# Patient Record
Sex: Female | Born: 1945 | Race: White | Hispanic: No | Marital: Married | Smoking: Never smoker
Health system: Southern US, Community
[De-identification: ages and names within clinical notes are randomized; demographics above are authoritative.]

## PROBLEM LIST (undated history)

## (undated) DIAGNOSIS — C50919 Malignant neoplasm of unspecified site of unspecified female breast: Secondary | ICD-10-CM

## (undated) DIAGNOSIS — T7840XA Allergy, unspecified, initial encounter: Secondary | ICD-10-CM

## (undated) DIAGNOSIS — Z923 Personal history of irradiation: Secondary | ICD-10-CM

## (undated) HISTORY — PX: HERNIA REPAIR: SHX51

## (undated) HISTORY — PX: CHOLECYSTECTOMY: SHX55

## (undated) HISTORY — PX: KNEE SURGERY: SHX244

## (undated) HISTORY — PX: OTHER SURGICAL HISTORY: SHX169

## (undated) HISTORY — PX: VAGINAL HYSTERECTOMY: SUR661

---

## 2009-02-08 DIAGNOSIS — C50919 Malignant neoplasm of unspecified site of unspecified female breast: Secondary | ICD-10-CM

## 2009-02-08 HISTORY — PX: BREAST BIOPSY: SHX20

## 2009-02-08 HISTORY — DX: Malignant neoplasm of unspecified site of unspecified female breast: C50.919

## 2009-02-08 HISTORY — PX: BREAST LUMPECTOMY: SHX2

## 2009-05-28 ENCOUNTER — Ambulatory Visit: Payer: Self-pay | Admitting: Surgery

## 2009-06-06 ENCOUNTER — Ambulatory Visit: Payer: Self-pay | Admitting: Surgery

## 2009-06-08 ENCOUNTER — Ambulatory Visit: Payer: Self-pay | Admitting: Radiation Oncology

## 2009-06-11 ENCOUNTER — Ambulatory Visit: Payer: Self-pay | Admitting: Surgery

## 2009-06-25 ENCOUNTER — Ambulatory Visit: Payer: Self-pay | Admitting: Internal Medicine

## 2009-07-09 ENCOUNTER — Ambulatory Visit: Payer: Self-pay | Admitting: Internal Medicine

## 2009-07-09 ENCOUNTER — Ambulatory Visit: Payer: Self-pay | Admitting: Radiation Oncology

## 2009-08-08 ENCOUNTER — Ambulatory Visit: Payer: Self-pay | Admitting: Radiation Oncology

## 2009-08-08 ENCOUNTER — Ambulatory Visit: Payer: Self-pay | Admitting: Internal Medicine

## 2009-09-08 ENCOUNTER — Ambulatory Visit: Payer: Self-pay | Admitting: Radiation Oncology

## 2009-09-08 ENCOUNTER — Ambulatory Visit: Payer: Self-pay | Admitting: Internal Medicine

## 2009-10-09 ENCOUNTER — Ambulatory Visit: Payer: Self-pay | Admitting: Internal Medicine

## 2009-10-09 ENCOUNTER — Ambulatory Visit: Payer: Self-pay | Admitting: Radiation Oncology

## 2009-11-08 ENCOUNTER — Ambulatory Visit: Payer: Self-pay | Admitting: Radiation Oncology

## 2009-11-08 ENCOUNTER — Ambulatory Visit: Payer: Self-pay | Admitting: Internal Medicine

## 2010-01-06 ENCOUNTER — Ambulatory Visit: Payer: Self-pay | Admitting: Internal Medicine

## 2010-02-24 ENCOUNTER — Ambulatory Visit: Payer: Self-pay | Admitting: Internal Medicine

## 2010-03-11 ENCOUNTER — Ambulatory Visit: Payer: Self-pay | Admitting: Internal Medicine

## 2010-04-09 ENCOUNTER — Ambulatory Visit: Payer: Self-pay | Admitting: Internal Medicine

## 2010-05-05 ENCOUNTER — Ambulatory Visit: Payer: Self-pay | Admitting: Surgery

## 2010-06-25 ENCOUNTER — Ambulatory Visit: Payer: Self-pay | Admitting: Internal Medicine

## 2010-07-10 ENCOUNTER — Ambulatory Visit: Payer: Self-pay | Admitting: Internal Medicine

## 2010-10-07 ENCOUNTER — Ambulatory Visit: Payer: Self-pay | Admitting: Internal Medicine

## 2010-10-10 ENCOUNTER — Ambulatory Visit: Payer: Self-pay | Admitting: Internal Medicine

## 2010-12-24 ENCOUNTER — Ambulatory Visit: Payer: Self-pay | Admitting: Internal Medicine

## 2011-01-09 ENCOUNTER — Ambulatory Visit: Payer: Self-pay | Admitting: Internal Medicine

## 2011-05-06 ENCOUNTER — Ambulatory Visit: Payer: Self-pay | Admitting: Internal Medicine

## 2011-06-24 ENCOUNTER — Ambulatory Visit: Payer: Self-pay | Admitting: Oncology

## 2011-06-24 LAB — COMPREHENSIVE METABOLIC PANEL WITH GFR
Albumin: 4 g/dL
Alkaline Phosphatase: 86 U/L
Anion Gap: 9
BUN: 25 mg/dL — ABNORMAL HIGH
Bilirubin,Total: 0.5 mg/dL
Calcium, Total: 9.1 mg/dL
Chloride: 105 mmol/L
Co2: 28 mmol/L
Creatinine: 0.81 mg/dL
EGFR (African American): >60
EGFR (Non-African Amer.): >60
Glucose: 115 mg/dL — ABNORMAL HIGH
Osmolality: 288
Potassium: 4.1 mmol/L
SGOT(AST): 15 U/L
SGPT (ALT): 27 U/L
Sodium: 142 mmol/L
Total Protein: 7 g/dL

## 2011-06-24 LAB — CBC CANCER CENTER
Basophil #: 0 x10 3/mm (ref 0.0–0.1)
Eosinophil #: 0.2 x10 3/mm (ref 0.0–0.7)
Eosinophil %: 3.2 %
HGB: 13 g/dL (ref 12.0–16.0)
MCH: 33.7 pg (ref 26.0–34.0)
MCHC: 34.5 g/dL (ref 32.0–36.0)
MCV: 98 fL (ref 80–100)
Monocyte #: 0.3 x10 3/mm (ref 0.2–0.9)
Monocyte %: 4.7 %
Neutrophil #: 4.4 x10 3/mm (ref 1.4–6.5)
Platelet: 233 x10 3/mm (ref 150–440)
RBC: 3.86 10*6/uL (ref 3.80–5.20)
RDW: 13.3 % (ref 11.5–14.5)

## 2011-07-10 ENCOUNTER — Ambulatory Visit: Payer: Self-pay | Admitting: Oncology

## 2011-11-08 ENCOUNTER — Ambulatory Visit: Payer: Self-pay | Admitting: Oncology

## 2011-11-09 ENCOUNTER — Ambulatory Visit: Payer: Self-pay | Admitting: Oncology

## 2011-12-27 ENCOUNTER — Ambulatory Visit: Payer: Self-pay | Admitting: Oncology

## 2012-01-09 ENCOUNTER — Ambulatory Visit: Payer: Self-pay | Admitting: Oncology

## 2012-05-08 ENCOUNTER — Ambulatory Visit: Payer: Self-pay | Admitting: Oncology

## 2012-06-23 ENCOUNTER — Ambulatory Visit: Payer: Self-pay | Admitting: Oncology

## 2012-07-09 ENCOUNTER — Ambulatory Visit: Payer: Self-pay | Admitting: Oncology

## 2012-11-06 ENCOUNTER — Ambulatory Visit: Payer: Self-pay | Admitting: Radiation Oncology

## 2012-11-08 ENCOUNTER — Ambulatory Visit: Payer: Self-pay | Admitting: Radiation Oncology

## 2013-05-09 ENCOUNTER — Ambulatory Visit: Payer: Self-pay | Admitting: Oncology

## 2013-05-10 ENCOUNTER — Ambulatory Visit: Payer: Self-pay | Admitting: Oncology

## 2013-06-08 ENCOUNTER — Ambulatory Visit: Payer: Self-pay | Admitting: Oncology

## 2013-11-06 ENCOUNTER — Ambulatory Visit: Payer: Self-pay | Admitting: Oncology

## 2014-05-14 ENCOUNTER — Ambulatory Visit
Admit: 2014-05-14 | Disposition: A | Discharge status: Home / Self Care | Payer: Self-pay | Attending: Oncology | Admitting: Oncology

## 2014-05-21 ENCOUNTER — Ambulatory Visit
Admit: 2014-05-21 | Disposition: A | Discharge status: Home / Self Care | Payer: Self-pay | Attending: Oncology | Admitting: Oncology

## 2014-11-06 ENCOUNTER — Ambulatory Visit
Admission: RE | Admit: 2014-11-06 | Discharge: 2014-11-06 | Disposition: A | Discharge status: Home / Self Care | Payer: Medicare Other | Source: Ambulatory Visit | Attending: Oncology | Admitting: Oncology

## 2014-11-06 ENCOUNTER — Other Ambulatory Visit: Payer: Self-pay | Admitting: Oncology

## 2014-11-06 ENCOUNTER — Encounter: Payer: Self-pay | Admitting: Radiation Oncology

## 2014-11-06 ENCOUNTER — Other Ambulatory Visit: Payer: Self-pay | Admitting: *Deleted

## 2014-11-06 ENCOUNTER — Ambulatory Visit
Admission: RE | Admit: 2014-11-06 | Discharge: 2014-11-06 | Disposition: A | Discharge status: Home / Self Care | Payer: Medicare Other | Source: Ambulatory Visit | Attending: Radiation Oncology | Admitting: Radiation Oncology

## 2014-11-06 VITALS — BP 143/89 | HR 84 | Temp 98.0°F | Ht 63.0 in | Wt 215.5 lb

## 2014-11-06 DIAGNOSIS — Z853 Personal history of malignant neoplasm of breast: Secondary | ICD-10-CM | POA: Insufficient documentation

## 2014-11-06 DIAGNOSIS — R928 Other abnormal and inconclusive findings on diagnostic imaging of breast: Secondary | ICD-10-CM | POA: Insufficient documentation

## 2014-11-06 DIAGNOSIS — N6320 Unspecified lump in the left breast, unspecified quadrant: Secondary | ICD-10-CM

## 2014-11-06 DIAGNOSIS — N63 Unspecified lump in breast: Secondary | ICD-10-CM | POA: Diagnosis present

## 2014-11-06 DIAGNOSIS — C50912 Malignant neoplasm of unspecified site of left female breast: Secondary | ICD-10-CM

## 2014-11-06 DIAGNOSIS — Z923 Personal history of irradiation: Secondary | ICD-10-CM | POA: Insufficient documentation

## 2014-11-06 HISTORY — DX: Allergy, unspecified, initial encounter: T78.40XA

## 2014-11-06 HISTORY — DX: Malignant neoplasm of unspecified site of unspecified female breast: C50.919

## 2014-11-06 NOTE — Progress Notes (Signed)
Radiation Oncology Follow up Note  Name: Kristi Schmidt   Date:   11/06/2014 MRN:  [id number] DOB: 09/24/1945    This 69 y.o. female presents to the clinic today for follow-up for breast cancer.  REFERRING PROVIDER: No ref. 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 Seen today in routine follow-up she is doing well. Her PMD found a mass in her left breast. We ordered mammograms today showing scar tissue. Her tumor was ER/PR negative she is not on tamoxifen. She otherwise is without complaint specifically denies breast tenderness cough or bone pain..  COMPLICATIONS OF TREATMENT: none  FOLLOW UP COMPLIANCE: keeps appointments   PHYSICAL EXAM:  BP 143/89 mmHg  Pulse 84  Temp(Src) 98 F (36.7 C)  Ht 5\' 3"  (1.6 m)  Wt 215 lb 8 oz (97.75 kg)  BMI 38.18 kg/m2 Lungs are clear to A&P cardiac examination essentially unremarkable with regular rate and rhythm. No dominant mass or nodularity is noted in either breast in 2 positions examined. Incision is well-healed. No axillary or supraclavicular adenopathy is appreciated. Cosmetic result is excellent. She does have some telangiectatic changes of the skin in her high-dose region. Due appreciate some nodularity her lumpectomy site consistent with scarring high-dose radiation. Well-developed well-nourished patient in NAD. HEENT reveals PERLA, EOMI, discs not visualized.  Oral cavity is clear. No oral mucosal lesions are identified. Neck is clear without evidence of cervical or supraclavicular adenopathy. Lungs are clear to A&P. Cardiac examination is essentially unremarkable with regular rate and rhythm without murmur rub or thrill. Abdomen is benign with no organomegaly or masses noted. Motor sensory and DTR levels are equal and symmetric in the upper and lower extremities. Cranial nerves II through XII are grossly intact.  Proprioception is intact. No peripheral adenopathy or edema is identified. No motor or sensory levels are noted. Crude visual fields are within normal range. RADIOLOGY RESULTS: Mammograms are reviewed  PLAN: Present time she is now 5 years out with no evidence of disease. I'm pleased her mammograms confirm which I suspected that this is scar tissue in her lumpectomy site. I'm discontinuing her follow-up care since were out 5 years. Patient is to call with any concerns.  I would like to take this opportunity for allowing me to participate in the care of your patient.Armstead Peaks., MD

## 2015-09-30 ENCOUNTER — Telehealth: Payer: Self-pay | Admitting: *Deleted

## 2015-09-30 ENCOUNTER — Encounter: Payer: Self-pay | Admitting: *Deleted

## 2015-09-30 NOTE — Progress Notes (Signed)
Talked with Jarrett Soho, RN today in regards to a question of orders for mammograms from a physician outside of .  Discussed order procedures with Deniece Hollie Salk and Bobbye Charleston from [facility]ography [facility]iology.  We are able to accept orders from a physician from another state as long as we have an NPI number.  Called patient and informed of the process to schedule her mammogram.  She is to call me if she has any difficulty.  She is agreeable, and stated she loved it here, but she loves her PCP in [place], and doesn't want to change.

## 2015-09-30 NOTE — Telephone Encounter (Signed)
I called pt and advised her that she has been discharged from clinic and that her PCP is supposed to order her mammograms, she stated she was told by [facility] that they will not take her PCP order because he is out of state in [place]. I then called Tanya Nones who will check into this and call patient back.

## 2015-10-08 ENCOUNTER — Other Ambulatory Visit: Payer: Self-pay | Admitting: Internal Medicine

## 2015-10-09 ENCOUNTER — Encounter: Payer: Self-pay | Admitting: *Deleted

## 2015-10-09 ENCOUNTER — Other Ambulatory Visit: Payer: Self-pay | Admitting: *Deleted

## 2015-10-09 DIAGNOSIS — Z853 Personal history of malignant neoplasm of breast: Secondary | ICD-10-CM

## 2015-10-09 NOTE — Progress Notes (Signed)
Patient called today with concerns that she could not schedule her mammogram after I had told her we would accept the order for her PCP in [place].  After speaking to the patient initially, I was given information that we would not be able to accept the order from her PCP, and then a maybe we can, without a definitive answer.  I discussed this case with Dr. Grayland Ormond and he agreed to write the order for the patient's mammogram if we could not accept an order.  Order placed for bilateral diagnostic mammogram for history of left breast cancer.  Transferred patient to the breast center to schedule her mammogram.  Will notify Dr. Grayland Ormond to sign the order.

## 2015-11-03 ENCOUNTER — Ambulatory Visit
Admission: RE | Admit: 2015-11-03 | Discharge: 2015-11-03 | Disposition: A | Discharge status: Home / Self Care | Payer: Medicare Other | Source: Ambulatory Visit | Attending: Oncology | Admitting: Oncology

## 2015-11-03 DIAGNOSIS — Z9889 Other specified postprocedural states: Secondary | ICD-10-CM | POA: Insufficient documentation

## 2015-11-03 DIAGNOSIS — Z853 Personal history of malignant neoplasm of breast: Secondary | ICD-10-CM

## 2016-10-20 ENCOUNTER — Other Ambulatory Visit: Payer: Self-pay | Admitting: Internal Medicine

## 2016-10-20 DIAGNOSIS — Z853 Personal history of malignant neoplasm of breast: Secondary | ICD-10-CM

## 2016-11-05 ENCOUNTER — Ambulatory Visit
Admission: RE | Admit: 2016-11-05 | Discharge: 2016-11-05 | Disposition: A | Discharge status: Home / Self Care | Payer: Medicare Other | Source: Ambulatory Visit | Attending: Internal Medicine | Admitting: Internal Medicine

## 2016-11-05 DIAGNOSIS — Z853 Personal history of malignant neoplasm of breast: Secondary | ICD-10-CM

## 2016-11-05 HISTORY — DX: Personal history of irradiation: Z92.3

## 2017-10-19 ENCOUNTER — Other Ambulatory Visit: Payer: Self-pay | Admitting: Internal Medicine

## 2017-10-19 DIAGNOSIS — Z1231 Encounter for screening mammogram for malignant neoplasm of breast: Secondary | ICD-10-CM

## 2017-11-21 ENCOUNTER — Ambulatory Visit
Admission: RE | Admit: 2017-11-21 | Discharge: 2017-11-21 | Disposition: A | Discharge status: Home / Self Care | Payer: Medicare Other | Source: Ambulatory Visit | Attending: Internal Medicine | Admitting: Internal Medicine

## 2017-11-21 DIAGNOSIS — Z1231 Encounter for screening mammogram for malignant neoplasm of breast: Secondary | ICD-10-CM

## 2019-03-20 ENCOUNTER — Other Ambulatory Visit: Payer: Self-pay | Admitting: Internal Medicine

## 2019-03-20 DIAGNOSIS — Z1231 Encounter for screening mammogram for malignant neoplasm of breast: Secondary | ICD-10-CM

## 2019-06-18 ENCOUNTER — Ambulatory Visit
Admission: RE | Admit: 2019-06-18 | Discharge: 2019-06-18 | Disposition: A | Discharge status: Home / Self Care | Payer: Medicare Other | Source: Ambulatory Visit | Attending: Internal Medicine | Admitting: Internal Medicine

## 2019-06-18 DIAGNOSIS — Z1231 Encounter for screening mammogram for malignant neoplasm of breast: Secondary | ICD-10-CM | POA: Insufficient documentation

## 2020-05-12 ENCOUNTER — Other Ambulatory Visit: Payer: Self-pay | Admitting: Internal Medicine

## 2020-05-12 DIAGNOSIS — Z1231 Encounter for screening mammogram for malignant neoplasm of breast: Secondary | ICD-10-CM

## 2020-06-23 ENCOUNTER — Other Ambulatory Visit: Payer: Self-pay

## 2020-06-23 ENCOUNTER — Ambulatory Visit
Admission: RE | Admit: 2020-06-23 | Discharge: 2020-06-23 | Disposition: A | Discharge status: Home / Self Care | Payer: Medicare Other | Source: Ambulatory Visit | Attending: Internal Medicine | Admitting: Internal Medicine

## 2020-06-23 DIAGNOSIS — Z1231 Encounter for screening mammogram for malignant neoplasm of breast: Secondary | ICD-10-CM | POA: Diagnosis present

## 2021-06-18 ENCOUNTER — Other Ambulatory Visit: Payer: Self-pay | Admitting: Internal Medicine

## 2021-06-18 DIAGNOSIS — Z1231 Encounter for screening mammogram for malignant neoplasm of breast: Secondary | ICD-10-CM

## 2021-07-30 ENCOUNTER — Ambulatory Visit
Admission: RE | Admit: 2021-07-30 | Discharge: 2021-07-30 | Disposition: A | Discharge status: Home / Self Care | Payer: Medicare Other | Source: Ambulatory Visit | Attending: Internal Medicine | Admitting: Internal Medicine

## 2021-07-30 DIAGNOSIS — Z1231 Encounter for screening mammogram for malignant neoplasm of breast: Secondary | ICD-10-CM

## 2022-06-22 ENCOUNTER — Other Ambulatory Visit: Payer: Self-pay | Admitting: Internal Medicine

## 2022-06-22 DIAGNOSIS — Z1231 Encounter for screening mammogram for malignant neoplasm of breast: Secondary | ICD-10-CM

## 2022-08-06 ENCOUNTER — Ambulatory Visit
Admission: RE | Admit: 2022-08-06 | Discharge: 2022-08-06 | Disposition: A | Discharge status: Home / Self Care | Payer: Medicare Other | Source: Ambulatory Visit | Attending: Internal Medicine | Admitting: Internal Medicine

## 2022-08-06 DIAGNOSIS — Z1231 Encounter for screening mammogram for malignant neoplasm of breast: Secondary | ICD-10-CM | POA: Insufficient documentation

## 2023-03-26 IMAGING — MG MM DIGITAL SCREENING BILAT W/ TOMO AND CAD
6 of 10 series · 6 of 30 positions shown · non-contrast
Comparison: Previous exam(s).

CLINICAL DATA: Screening.

EXAM:
DIGITAL SCREENING BILATERAL MAMMOGRAM WITH TOMOSYNTHESIS AND CAD
TECHNIQUE: Bilateral screening digital craniocaudal and mediolateral oblique
mammograms were obtained. Bilateral screening digital breast
tomosynthesis was performed. The images were evaluated with
computer-aided detection.

[L MLO synth-2D (1 of 2)]
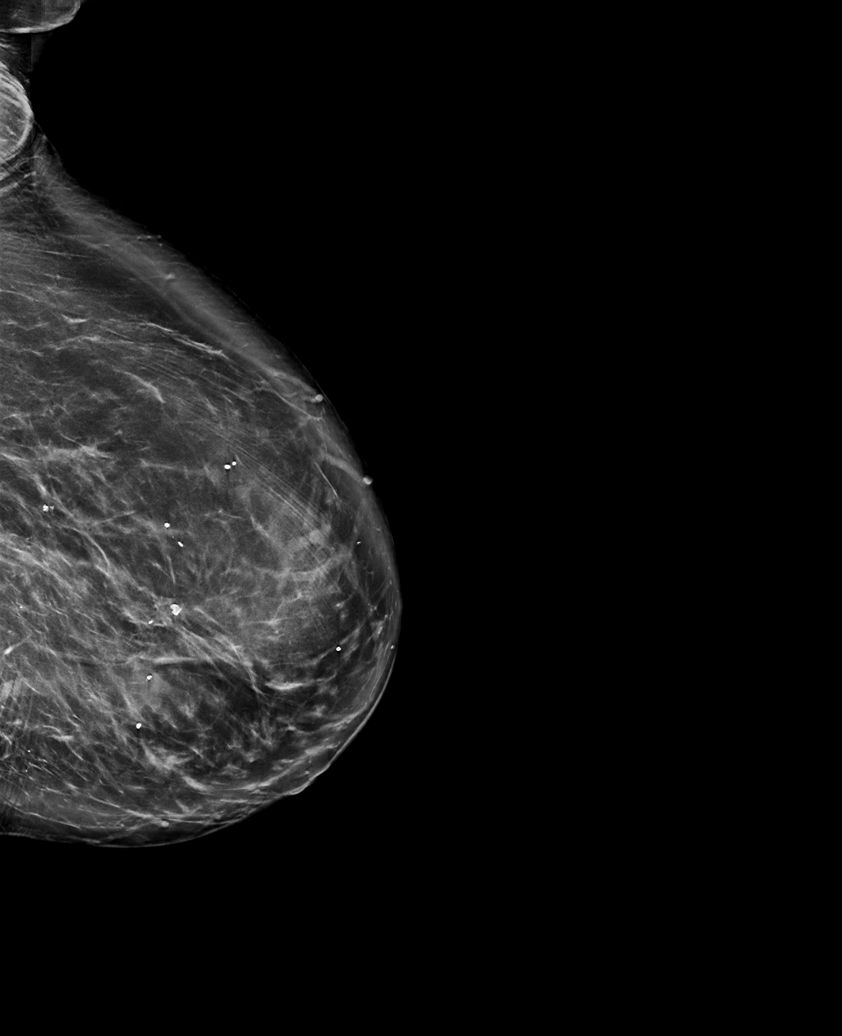

[L CC synth-2D]
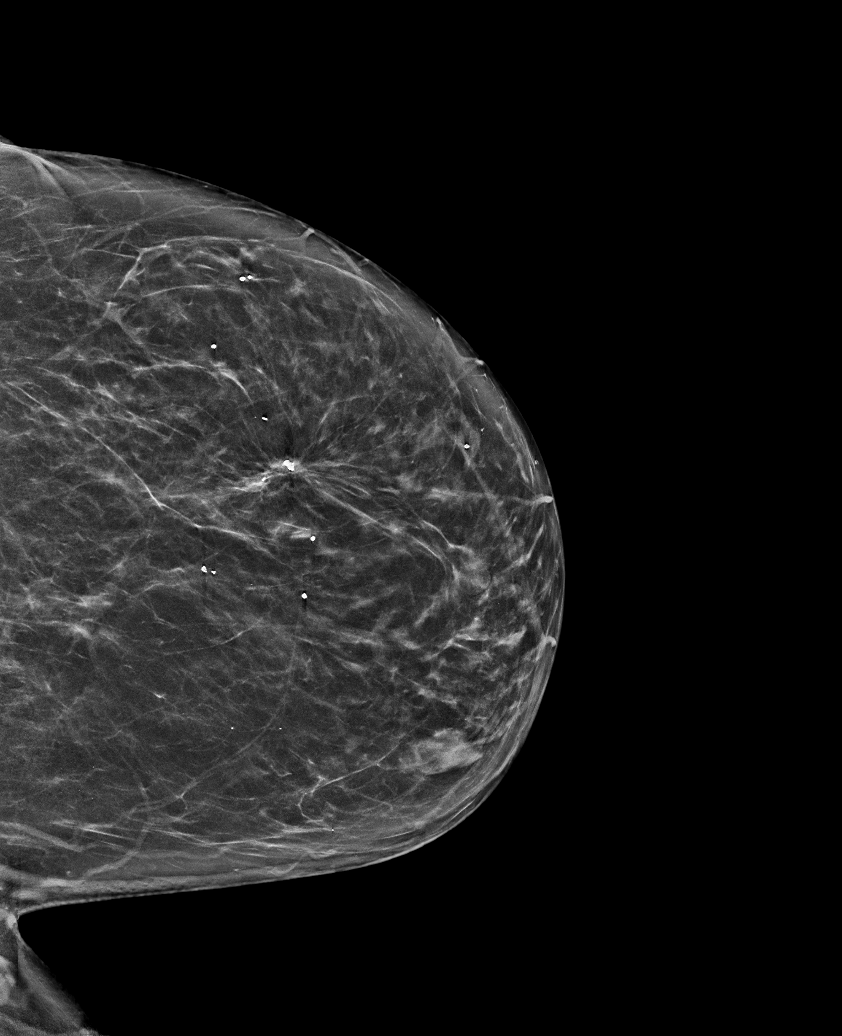

[R MLO synth-2D]
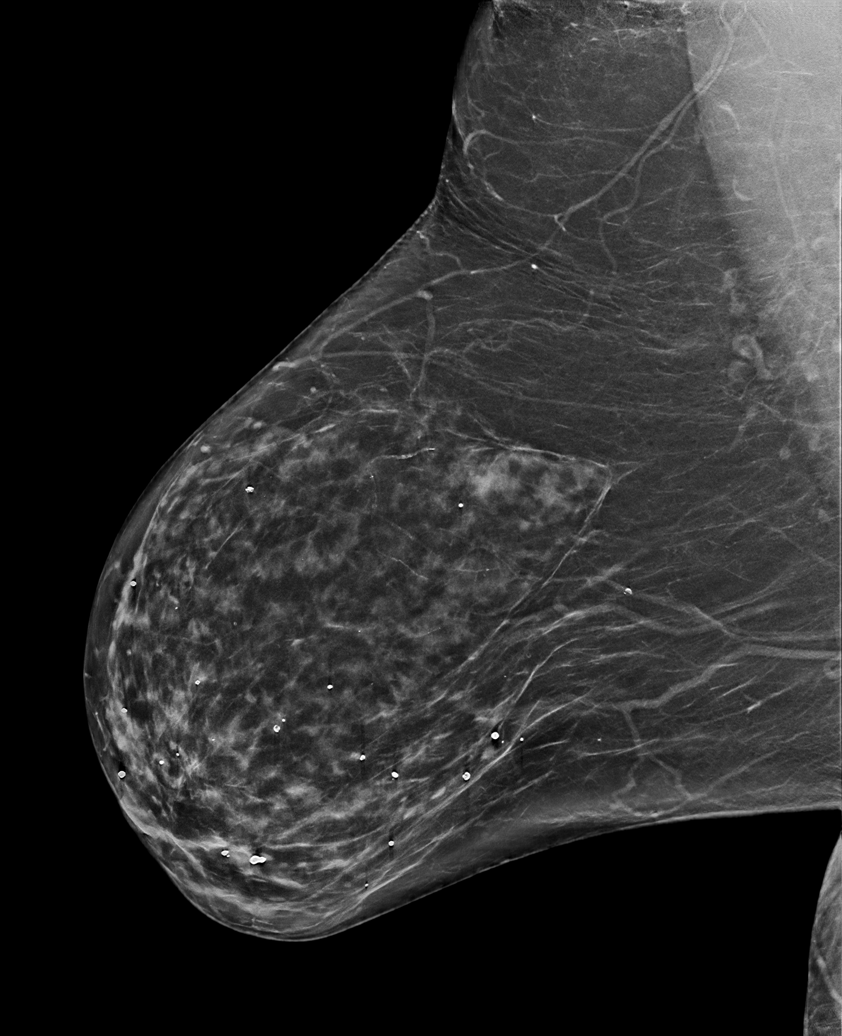

[L MLO synth-2D (2 of 2)]
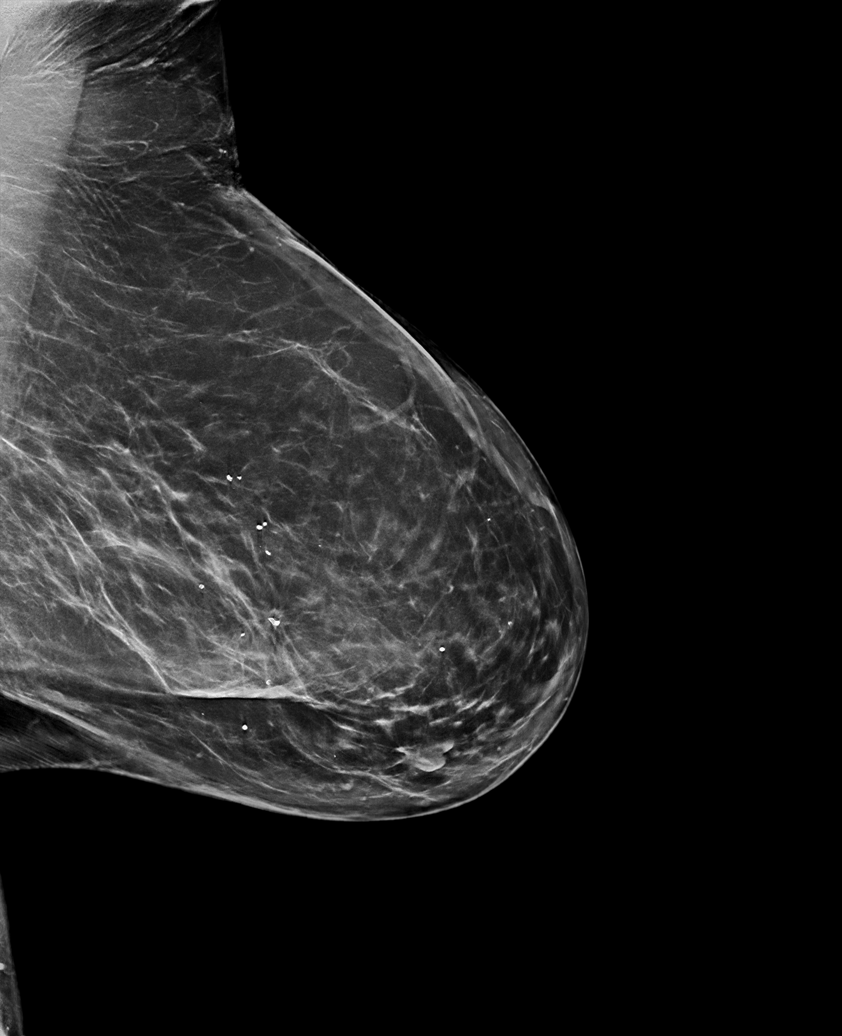

[R CC synth-2D]
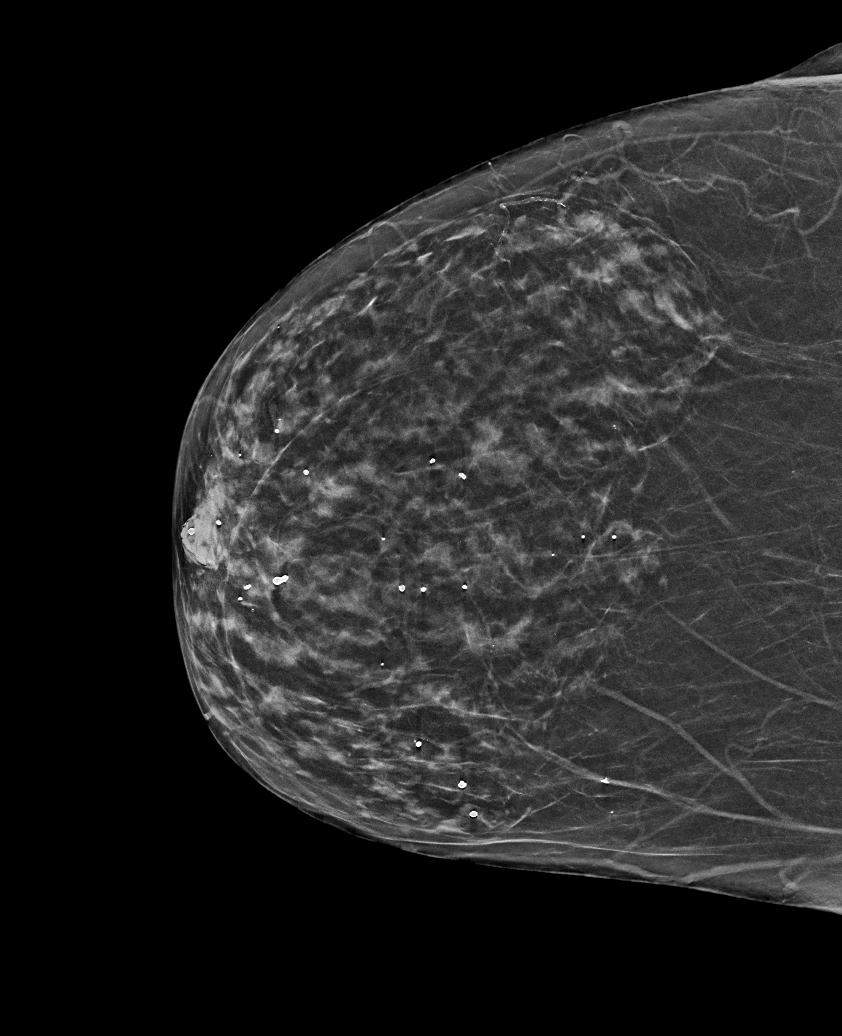

[L MLO tomo · tomo slice 45/89.0]
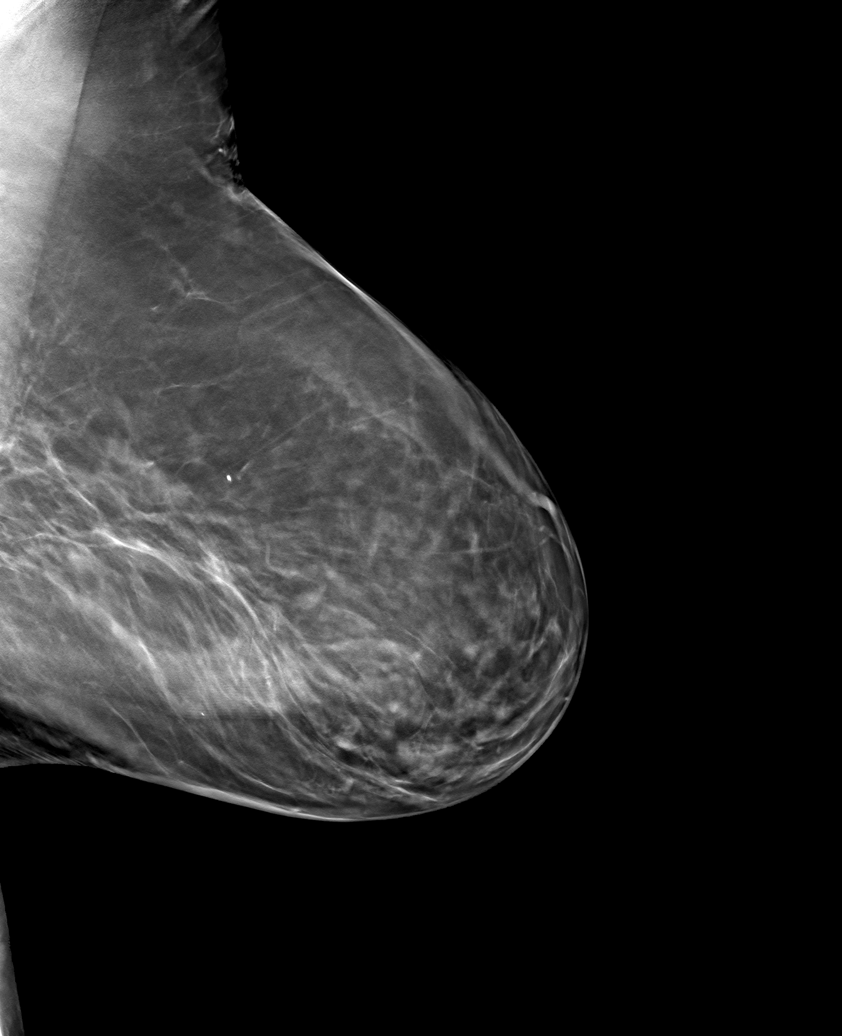

[6 of 30 positions shown; findings below may reference images not displayed]

ACR Breast Density Category b: There are scattered areas of
fibroglandular density.
FINDINGS: There are no findings suspicious for malignancy. The images were
evaluated with computer-aided detection.
IMPRESSION: No mammographic evidence of malignancy. A result letter of this
screening mammogram will be mailed directly to the patient.

RECOMMENDATION:
Screening mammogram in one year. (Code:WJ-[id number])

BI-RADS CATEGORY  1: Negative.

## 2023-07-05 ENCOUNTER — Other Ambulatory Visit: Payer: Self-pay | Admitting: Internal Medicine

## 2023-07-05 DIAGNOSIS — Z1231 Encounter for screening mammogram for malignant neoplasm of breast: Secondary | ICD-10-CM

## 2023-08-08 ENCOUNTER — Ambulatory Visit
Admission: RE | Admit: 2023-08-08 | Discharge: 2023-08-08 | Disposition: A | Discharge status: Home / Self Care | Source: Ambulatory Visit | Attending: Internal Medicine | Admitting: Internal Medicine

## 2023-08-08 DIAGNOSIS — Z1231 Encounter for screening mammogram for malignant neoplasm of breast: Secondary | ICD-10-CM | POA: Insufficient documentation
